# Patient Record
Sex: Female | Born: 1980 | Race: Asian | Hispanic: No | Marital: Married | Smoking: Never smoker
Health system: Southern US, Community
[De-identification: ages and names within clinical notes are randomized; demographics above are authoritative.]

## PROBLEM LIST (undated history)

## (undated) DIAGNOSIS — Z789 Other specified health status: Secondary | ICD-10-CM

---

## 2013-02-19 ENCOUNTER — Other Ambulatory Visit: Payer: Self-pay | Admitting: Infectious Diseases

## 2013-02-19 ENCOUNTER — Ambulatory Visit
Admission: RE | Admit: 2013-02-19 | Discharge: 2013-02-19 | Disposition: A | Discharge status: Home / Self Care | Payer: PRIVATE HEALTH INSURANCE | Source: Ambulatory Visit | Attending: Infectious Diseases | Admitting: Infectious Diseases

## 2013-02-19 DIAGNOSIS — R7611 Nonspecific reaction to tuberculin skin test without active tuberculosis: Secondary | ICD-10-CM

## 2015-01-22 ENCOUNTER — Encounter: Payer: Self-pay | Admitting: Obstetrics and Gynecology

## 2015-04-15 LAB — GLUCOSE, POCT (MANUAL RESULT ENTRY): POC Glucose: 133 mg/dl — AB (ref 70–99)

## 2015-09-23 ENCOUNTER — Encounter: Payer: Self-pay | Admitting: *Deleted

## 2015-09-23 DIAGNOSIS — Z139 Encounter for screening, unspecified: Secondary | ICD-10-CM

## 2015-09-23 NOTE — Congregational Nurse Program (Signed)
Congregational Nurse Program Note  Date of Encounter: 09/23/2015  Past Medical History: No past medical history on file.  Encounter Details:     CNP Questionnaire - 09/23/15 1841    Patient Demographics   Is this a new or existing patient? New   Patient is considered a/an Asylee   Race Asian   Patient Assistance   Location of Patient Assistance [place]   Patient's financial/insurance status Medicaid   Uninsured Patient No   Patient referred to apply for the following financial assistance Not Applicable   Food insecurities addressed Not Applicable   Transportation assistance No   Assistance securing medications No   Educational health offerings Other   Encounter Details   Primary purpose of visit Other   Was an Emergency Department visit averted? Not Applicable   Does patient have a medical provider? Yes   Patient referred to Not Applicable   Was a mental health screening completed? (GAINS tool) No   Does patient have dental issues? No   Does patient have vision issues? No   Since previous encounter, have you referred patient for abnormal blood pressure that resulted in a new diagnosis or medication change? No   Since previous encounter, have you referred patient for abnormal blood glucose that resulted in a new diagnosis or medication change? No       Client came to center to get help to change her Medicaid [facility]  . Changes were made and  client already has appointment with Doctors. She is four months pregnant. [phone]

## 2015-10-16 LAB — OB RESULTS CONSOLE ABO/RH: RH TYPE: POSITIVE

## 2015-10-16 LAB — OB RESULTS CONSOLE GC/CHLAMYDIA
CHLAMYDIA, DNA PROBE: NEGATIVE
Gonorrhea: NEGATIVE

## 2015-10-16 LAB — OB RESULTS CONSOLE RUBELLA ANTIBODY, IGM: Rubella: IMMUNE

## 2015-10-16 LAB — OB RESULTS CONSOLE ANTIBODY SCREEN: ANTIBODY SCREEN: NEGATIVE

## 2015-10-16 LAB — OB RESULTS CONSOLE HIV ANTIBODY (ROUTINE TESTING): HIV: NONREACTIVE

## 2015-10-16 LAB — OB RESULTS CONSOLE RPR: RPR: NONREACTIVE

## 2015-10-16 LAB — OB RESULTS CONSOLE HEPATITIS B SURFACE ANTIGEN: HEP B S AG: NEGATIVE

## 2015-10-17 ENCOUNTER — Other Ambulatory Visit (HOSPITAL_COMMUNITY): Payer: Self-pay | Admitting: Nurse Practitioner

## 2015-10-17 DIAGNOSIS — Z3A21 21 weeks gestation of pregnancy: Secondary | ICD-10-CM

## 2015-10-17 DIAGNOSIS — Z3689 Encounter for other specified antenatal screening: Secondary | ICD-10-CM

## 2015-10-17 DIAGNOSIS — O09522 Supervision of elderly multigravida, second trimester: Secondary | ICD-10-CM

## 2015-10-23 ENCOUNTER — Ambulatory Visit (HOSPITAL_COMMUNITY)
Admission: RE | Admit: 2015-10-23 | Discharge: 2015-10-23 | Disposition: A | Discharge status: Home / Self Care | Payer: Medicaid Other | Source: Ambulatory Visit | Attending: Nurse Practitioner | Admitting: Nurse Practitioner

## 2015-10-23 ENCOUNTER — Other Ambulatory Visit (HOSPITAL_COMMUNITY): Payer: Self-pay | Admitting: Nurse Practitioner

## 2015-10-23 ENCOUNTER — Encounter (HOSPITAL_COMMUNITY): Payer: Self-pay

## 2015-10-23 VITALS — BP 108/67 | HR 87 | Wt 114.4 lb

## 2015-10-23 DIAGNOSIS — Z3A21 21 weeks gestation of pregnancy: Secondary | ICD-10-CM

## 2015-10-23 DIAGNOSIS — O0932 Supervision of pregnancy with insufficient antenatal care, second trimester: Secondary | ICD-10-CM | POA: Insufficient documentation

## 2015-10-23 DIAGNOSIS — O35EXX0 Maternal care for other (suspected) fetal abnormality and damage, fetal genitourinary anomalies, not applicable or unspecified: Secondary | ICD-10-CM

## 2015-10-23 DIAGNOSIS — Z3689 Encounter for other specified antenatal screening: Secondary | ICD-10-CM

## 2015-10-23 DIAGNOSIS — O09522 Supervision of elderly multigravida, second trimester: Secondary | ICD-10-CM | POA: Diagnosis not present

## 2015-10-23 DIAGNOSIS — Z3A22 22 weeks gestation of pregnancy: Secondary | ICD-10-CM | POA: Insufficient documentation

## 2015-10-23 DIAGNOSIS — Z36 Encounter for antenatal screening of mother: Secondary | ICD-10-CM | POA: Diagnosis not present

## 2015-10-23 DIAGNOSIS — O34219 Maternal care for unspecified type scar from previous cesarean delivery: Secondary | ICD-10-CM

## 2015-10-23 DIAGNOSIS — O09529 Supervision of elderly multigravida, unspecified trimester: Secondary | ICD-10-CM

## 2015-10-23 DIAGNOSIS — Z315 Encounter for genetic counseling: Secondary | ICD-10-CM | POA: Insufficient documentation

## 2015-10-23 DIAGNOSIS — O358XX0 Maternal care for other (suspected) fetal abnormality and damage, not applicable or unspecified: Secondary | ICD-10-CM

## 2015-10-23 HISTORY — DX: Other specified health status: Z78.9

## 2015-10-23 NOTE — ED Notes (Signed)
Pt reports low back pain.

## 2015-10-23 NOTE — Progress Notes (Signed)
Genetic Counseling  High-Risk Gestation Note  Appointment Date:  10/23/2015 Referred By: Alberteen Spindle, NP Date of Birth:  1980-09-23   Pregnancy History: G2P1001 Estimated Date of Delivery: 02/24/16 Estimated Gestational Age: [redacted]w[redacted]d Attending: Alpha Gula, MD  [name] was seen for genetic counseling because of a maternal age of 35 y.o..  Burmese/English interpretation provided by Win from [employer].   In Summary:   Reviewed maternal age related risk for fetal aneuploidy  Detailed ultrasound today visualized bilateral mild pyelectasis (urinary tract dilation)  Reviewed as soft marker for Down syndrome, slightly adjusting patient's age related risk  Follow-up ultrasound scheduled for 12/18/15  Patient declined NIPS and amniocentesis  Family history unremarkable   She was counseled regarding maternal age and the association with risk for chromosome conditions due to nondisjunction with aging of the ova.   We reviewed chromosomes, nondisjunction, and the associated 1 in 111 risk for fetal aneuploidy related to a maternal age of 35 y.o. at [redacted]w[redacted]d gestation.  She was counseled that the risk for aneuploidy decreases as gestational age increases, accounting for those pregnancies which spontaneously abort.  We specifically discussed Down syndrome (trisomy 31), trisomies 4 and 24, and sex chromosome aneuploidies (47,XXX and 47,XXY) including the common features and prognoses of each.   Detailed ultrasound was performed today. Mild, bilateral urinary tract dilation visualized (~4 mm). Remaining visualized fetal anatomy appeared normal. Complete ultrasound results reported separately. We reviewed the benefits and limitations of ultrasound as a screening tool for fetal aneuploidy.   We discussed that fetal urinary tract dilation (pyelectasis) is defined as the dilatation of the fetal renal pelvis/pelvises due to excess urine. This finding is estimated to occur in 2-3% of fetuses.   The female to female ratio is 2:1.  Typically, babies with mild pyelectasis are born normal and healthy and we are usually unable to determine why this extra fluid is present.  This urine accumulation may regress, stay the same or continue to accumulate.  The more fluid that accumulates, the more likely this fluid could be the result of a compromise in kidney function, an obstruction, or narrowing of the ureters which transport urine out of the body, thus causing backflow of fluid into the kidneys.  Therefore, it is important to follow pyelectasis to make sure it does not become more concerning.  Also, in some cases postnatal evaluation of baby's kidneys may be warranted.  We discussed that the finding of pyelectasis is associated with an increased risk for fetal aneuploidy.  This risk is highest when other anomalies or fetal differences are visualized. The finding of mild urinary tract dilation would slightly increase the chance for fetal Down syndrome above the patient's age related risk to approximately 1 in 125.   We reviewed the additional available screening option of noninvasive prenatal screening (NIPS)/cell free DNA (cfDNA) testing.  She was counseled that screening tests are used to modify a patient's a priori risk for aneuploidy, typically based on age. This estimate provides a pregnancy specific risk assessment. We reviewed the benefits and limitations of each option. Specifically, we discussed the conditions for which each test screens, the detection rates, and false positive rates of each. She was also counseled regarding diagnostic testing via amniocentesis. We reviewed the approximate 1 in 300-500 risk for complications for amniocentesis, including spontaneous pregnancy loss. We discussed the possible results that the tests might provide including: positive, negative, unanticipated, and no result. Finally, she was counseled regarding the cost of each option  and potential out of pocket expenses.  After consideration of all the options, she declined NIPS and amniocentesis.   Follow-up ultrasound is scheduled for 12/18/15 to reassess fetal kidneys. She understands that screening tests cannot rule out all birth defects or genetic syndromes. The patient was advised of this limitation and states she still does not want additional testing at this time.   Both family histories were reviewed and found to be noncontributory for birth defects, intellectual disability, and known genetic conditions. Without further information regarding the provided family history, an accurate genetic risk cannot be calculated. Further genetic counseling is warranted if more information is obtained.  [name] denied exposure to environmental toxins or chemical agents. She denied the use of alcohol, tobacco or street drugs. She denied significant viral illnesses during the course of her pregnancy. Her medical and surgical histories were noncontributory.   I counseled [name] regarding the above risks and available options.  The approximate face-to-face time with the genetic counselor was 45 minutes.  Quinn PlowmanKaren Daisie Haft, MS,  Certified Genetic Counselor 10/23/2015

## 2015-11-18 ENCOUNTER — Encounter: Payer: Self-pay | Admitting: *Deleted

## 2015-11-18 DIAGNOSIS — Z139 Encounter for screening, unspecified: Secondary | ICD-10-CM

## 2015-11-18 NOTE — Congregational Nurse Program (Signed)
Congregational Nurse Program Note  Date of Encounter: 11/18/2015  Past Medical History: Past Medical History  Diagnosis Date  . Medical history non-contributory     Encounter Details:     CNP Questionnaire - 11/18/15 1549    Patient Demographics   Is this a new or existing patient? New   Patient is considered a/an Asylee   Race Asian   Patient Assistance   Location of Patient Assistance [place]   Patient's financial/insurance status Medicaid   Uninsured Patient No   Patient referred to apply for the following financial assistance Not Applicable   Food insecurities addressed Not Applicable   Transportation assistance No   Assistance securing medications No   Educational health offerings Other   Encounter Details   Primary purpose of visit Other   Was an Emergency Department visit averted? Not Applicable   Does patient have a medical provider? Yes   Patient referred to Not Applicable   Was a mental health screening completed? (GAINS tool) No   Does patient have dental issues? No   Does patient have vision issues? No   Since previous encounter, have you referred patient for abnormal blood pressure that resulted in a new diagnosis or medication change? No   Since previous encounter, have you referred patient for abnormal blood glucose that resulted in a new diagnosis or medication change? No       Client only wanted her BP checked and also more prenatal vitamins. It was explained to her by interpreter Ree that she has to go to her MD / Health Department to get more. She already has appointment but was told to go now to get her medications.Estée LauderHelena Maren Wiesen RN BSN CN [phone]

## 2015-12-18 ENCOUNTER — Encounter (HOSPITAL_COMMUNITY): Payer: Self-pay

## 2015-12-18 ENCOUNTER — Ambulatory Visit (HOSPITAL_COMMUNITY)
Admission: RE | Admit: 2015-12-18 | Discharge: 2015-12-18 | Disposition: A | Discharge status: Home / Self Care | Payer: Medicaid Other | Source: Ambulatory Visit | Attending: Nurse Practitioner | Admitting: Nurse Practitioner

## 2015-12-18 ENCOUNTER — Other Ambulatory Visit (HOSPITAL_COMMUNITY): Payer: Self-pay | Admitting: Maternal and Fetal Medicine

## 2015-12-18 ENCOUNTER — Ambulatory Visit (HOSPITAL_COMMUNITY): Payer: Medicaid Other

## 2015-12-18 VITALS — BP 103/61 | HR 92 | Wt 120.2 lb

## 2015-12-18 DIAGNOSIS — O35EXX0 Maternal care for other (suspected) fetal abnormality and damage, fetal genitourinary anomalies, not applicable or unspecified: Secondary | ICD-10-CM

## 2015-12-18 DIAGNOSIS — Z3A30 30 weeks gestation of pregnancy: Secondary | ICD-10-CM

## 2015-12-18 DIAGNOSIS — O09523 Supervision of elderly multigravida, third trimester: Secondary | ICD-10-CM

## 2015-12-18 DIAGNOSIS — O358XX0 Maternal care for other (suspected) fetal abnormality and damage, not applicable or unspecified: Secondary | ICD-10-CM

## 2015-12-18 DIAGNOSIS — O09529 Supervision of elderly multigravida, unspecified trimester: Secondary | ICD-10-CM

## 2016-01-22 ENCOUNTER — Other Ambulatory Visit: Payer: Self-pay | Admitting: Obstetrics & Gynecology

## 2016-01-29 ENCOUNTER — Encounter (HOSPITAL_COMMUNITY): Payer: Self-pay

## 2016-01-29 ENCOUNTER — Ambulatory Visit (HOSPITAL_COMMUNITY)
Admission: RE | Admit: 2016-01-29 | Discharge: 2016-01-29 | Disposition: A | Discharge status: Home / Self Care | Payer: Medicaid Other | Source: Ambulatory Visit | Attending: Nurse Practitioner | Admitting: Nurse Practitioner

## 2016-01-29 DIAGNOSIS — Z36 Encounter for antenatal screening of mother: Secondary | ICD-10-CM | POA: Diagnosis present

## 2016-01-29 DIAGNOSIS — O0932 Supervision of pregnancy with insufficient antenatal care, second trimester: Secondary | ICD-10-CM | POA: Diagnosis not present

## 2016-01-29 DIAGNOSIS — Q620 Congenital hydronephrosis: Secondary | ICD-10-CM | POA: Diagnosis not present

## 2016-01-29 DIAGNOSIS — O09523 Supervision of elderly multigravida, third trimester: Secondary | ICD-10-CM | POA: Diagnosis present

## 2016-01-29 DIAGNOSIS — O358XX0 Maternal care for other (suspected) fetal abnormality and damage, not applicable or unspecified: Secondary | ICD-10-CM | POA: Diagnosis present

## 2016-01-29 DIAGNOSIS — Z3A36 36 weeks gestation of pregnancy: Secondary | ICD-10-CM | POA: Insufficient documentation

## 2016-01-29 DIAGNOSIS — O35EXX0 Maternal care for other (suspected) fetal abnormality and damage, fetal genitourinary anomalies, not applicable or unspecified: Secondary | ICD-10-CM

## 2016-02-06 ENCOUNTER — Encounter (HOSPITAL_COMMUNITY): Payer: Self-pay

## 2016-02-06 NOTE — Pre-Procedure Instructions (Signed)
[phone] interpreter number

## 2016-02-13 ENCOUNTER — Encounter (HOSPITAL_COMMUNITY): Payer: Self-pay

## 2016-02-13 NOTE — Patient Instructions (Signed)
[address]  02/13/2016   Your procedure is scheduled on:  02/17/2016  Enter through the Main Entrance of [facility] at 0800 AM.  Pick up the phone at the desk and dial [phone].   Call this number if you have problems the morning of surgery: [phone]   Remember:   Do not eat food:After Midnight.  Do not drink clear liquids: After Midnight.  Take these medicines the morning of surgery with A SIP OF WATER: none   Do not wear jewelry, make-up or nail polish.  Do not wear lotions, powders, or perfumes. You may wear deodorant.  Do not shave 48 hours prior to surgery.  Do not bring valuables to the hospital.  [facility] is not   responsible for any belongings or valuables brought to the hospital.  Contacts, dentures or bridgework may not be worn into surgery.  Leave suitcase in the car. After surgery it may be brought to your room.  For patients admitted to the hospital, checkout time is 11:00 AM the day of              discharge.   Patients discharged the day of surgery will not be allowed to drive             home.  Name and phone number of your driver: na  Special Instructions:   N/A   Please read over the following fact sheets that you were given:   Surgical Site Infection Prevention

## 2016-02-16 ENCOUNTER — Encounter (HOSPITAL_COMMUNITY)
Admission: RE | Admit: 2016-02-16 | Discharge: 2016-02-16 | Disposition: A | Discharge status: Home / Self Care | Payer: Medicaid Other | Source: Ambulatory Visit | Attending: Obstetrics & Gynecology | Admitting: Obstetrics & Gynecology

## 2016-02-16 LAB — CBC
HCT: 32.7 % — ABNORMAL LOW (ref 36.0–46.0)
HEMOGLOBIN: 11 g/dL — AB (ref 12.0–15.0)
MCH: 31.3 pg (ref 26.0–34.0)
MCHC: 33.6 g/dL (ref 30.0–36.0)
MCV: 92.9 fL (ref 78.0–100.0)
PLATELETS: 217 10*3/uL (ref 150–400)
RBC: 3.52 MIL/uL — AB (ref 3.87–5.11)
RDW: 12.8 % (ref 11.5–15.5)
WBC: 8.3 10*3/uL (ref 4.0–10.5)

## 2016-02-16 LAB — TYPE AND SCREEN
ABO/RH(D): A POS
ANTIBODY SCREEN: NEGATIVE

## 2016-02-16 LAB — ABO/RH: ABO/RH(D): A POS

## 2016-02-17 ENCOUNTER — Inpatient Hospital Stay (HOSPITAL_COMMUNITY): Payer: Medicaid Other | Admitting: Anesthesiology

## 2016-02-17 ENCOUNTER — Encounter (HOSPITAL_COMMUNITY): Payer: Self-pay | Admitting: Anesthesiology

## 2016-02-17 ENCOUNTER — Encounter (HOSPITAL_COMMUNITY)
Admission: RE | Disposition: A | Discharge status: Home / Self Care | Payer: Self-pay | Source: Ambulatory Visit | Attending: Obstetrics & Gynecology

## 2016-02-17 ENCOUNTER — Inpatient Hospital Stay (HOSPITAL_COMMUNITY)
Admission: RE | Admit: 2016-02-17 | Discharge: 2016-02-19 | DRG: 766 | Disposition: A | Discharge status: Home / Self Care | Payer: Medicaid Other | Source: Ambulatory Visit | Attending: Obstetrics & Gynecology | Admitting: Obstetrics & Gynecology

## 2016-02-17 DIAGNOSIS — Z3A39 39 weeks gestation of pregnancy: Secondary | ICD-10-CM

## 2016-02-17 DIAGNOSIS — Z8249 Family history of ischemic heart disease and other diseases of the circulatory system: Secondary | ICD-10-CM

## 2016-02-17 DIAGNOSIS — O09529 Supervision of elderly multigravida, unspecified trimester: Secondary | ICD-10-CM

## 2016-02-17 DIAGNOSIS — O34219 Maternal care for unspecified type scar from previous cesarean delivery: Secondary | ICD-10-CM | POA: Diagnosis present

## 2016-02-17 DIAGNOSIS — O34211 Maternal care for low transverse scar from previous cesarean delivery: Principal | ICD-10-CM | POA: Diagnosis present

## 2016-02-17 LAB — RPR: RPR: NONREACTIVE

## 2016-02-17 SURGERY — Surgical Case
Anesthesia: Spinal

## 2016-02-17 MED ORDER — BUPIVACAINE IN DEXTROSE 0.75-8.25 % IT SOLN
INTRATHECAL | Status: DC | PRN
  Administered 2016-02-17: 9 mg via INTRATHECAL

## 2016-02-17 MED ORDER — NALBUPHINE HCL 10 MG/ML IJ SOLN: 5.0000 mg | Freq: Once | INTRAMUSCULAR | Status: DC | PRN

## 2016-02-17 MED ORDER — NALOXONE HCL 2 MG/2ML IJ SOSY
1.0000 ug/kg/h | PREFILLED_SYRINGE | INTRAMUSCULAR | Status: DC | PRN
  Filled 2016-02-17: qty 2

## 2016-02-17 MED ORDER — BUPIVACAINE HCL (PF) 0.5 % IJ SOLN
INTRAMUSCULAR | Status: AC
Start: 2016-02-17 — End: 2016-02-17
  Filled 2016-02-17: qty 30

## 2016-02-17 MED ORDER — ACETAMINOPHEN 325 MG PO TABS: 650.0000 mg | ORAL_TABLET | ORAL | Status: DC | PRN

## 2016-02-17 MED ORDER — MEPERIDINE HCL 25 MG/ML IJ SOLN: 6.2500 mg | INTRAMUSCULAR | Status: DC | PRN

## 2016-02-17 MED ORDER — SODIUM CHLORIDE 0.9 % IR SOLN
Status: DC | PRN
  Administered 2016-02-17: 1

## 2016-02-17 MED ORDER — SODIUM CHLORIDE 0.9% FLUSH: 3.0000 mL | INTRAVENOUS | Status: DC | PRN

## 2016-02-17 MED ORDER — PHENYLEPHRINE 8 MG IN D5W 100 ML (0.08MG/ML) PREMIX OPTIME
INJECTION | INTRAVENOUS | Status: DC | PRN
  Administered 2016-02-17: 60 ug/min via INTRAVENOUS

## 2016-02-17 MED ORDER — ZOLPIDEM TARTRATE 5 MG PO TABS: 5.0000 mg | ORAL_TABLET | Freq: Every evening | ORAL | Status: DC | PRN

## 2016-02-17 MED ORDER — KETOROLAC TROMETHAMINE 30 MG/ML IJ SOLN
30.0000 mg | Freq: Four times a day (QID) | INTRAMUSCULAR | Status: AC | PRN
  Administered 2016-02-17: 30 mg via INTRAMUSCULAR

## 2016-02-17 MED ORDER — ONDANSETRON HCL 4 MG/2ML IJ SOLN
INTRAMUSCULAR | Status: DC | PRN
  Administered 2016-02-17: 4 mg via INTRAVENOUS

## 2016-02-17 MED ORDER — PROMETHAZINE HCL 25 MG/ML IJ SOLN: 6.2500 mg | INTRAMUSCULAR | Status: DC | PRN

## 2016-02-17 MED ORDER — SIMETHICONE 80 MG PO CHEW
80.0000 mg | CHEWABLE_TABLET | Freq: Three times a day (TID) | ORAL | Status: DC
  Administered 2016-02-17 – 2016-02-19 (×5): 80 mg via ORAL
  Filled 2016-02-17 (×5): qty 1

## 2016-02-17 MED ORDER — LACTATED RINGERS IV SOLN
INTRAVENOUS | Status: DC
  Administered 2016-02-17 (×3): via INTRAVENOUS

## 2016-02-17 MED ORDER — DEXAMETHASONE SODIUM PHOSPHATE 4 MG/ML IJ SOLN
INTRAMUSCULAR | Status: DC | PRN
  Administered 2016-02-17: 4 mg via INTRAVENOUS

## 2016-02-17 MED ORDER — LACTATED RINGERS IV SOLN
Freq: Once | INTRAVENOUS | Status: AC
  Administered 2016-02-17: 09:00:00 via INTRAVENOUS

## 2016-02-17 MED ORDER — SIMETHICONE 80 MG PO CHEW: 80.0000 mg | CHEWABLE_TABLET | ORAL | Status: DC | PRN

## 2016-02-17 MED ORDER — IBUPROFEN 600 MG PO TABS: 600.0000 mg | ORAL_TABLET | Freq: Four times a day (QID) | ORAL | Status: DC

## 2016-02-17 MED ORDER — NALBUPHINE HCL 10 MG/ML IJ SOLN: 5.0000 mg | INTRAMUSCULAR | Status: DC | PRN

## 2016-02-17 MED ORDER — SCOPOLAMINE 1 MG/3DAYS TD PT72
MEDICATED_PATCH | TRANSDERMAL | Status: AC
  Administered 2016-02-17: 1.5 mg via TRANSDERMAL
  Filled 2016-02-17: qty 1

## 2016-02-17 MED ORDER — COCONUT OIL OIL: 1.0000 "application " | TOPICAL_OIL | Status: DC | PRN

## 2016-02-17 MED ORDER — MORPHINE SULFATE (PF) 0.5 MG/ML IJ SOLN
INTRAMUSCULAR | Status: DC | PRN
  Administered 2016-02-17: .2 mg via INTRATHECAL

## 2016-02-17 MED ORDER — BUPIVACAINE HCL (PF) 0.5 % IJ SOLN
INTRAMUSCULAR | Status: DC | PRN
  Administered 2016-02-17: 30 mL

## 2016-02-17 MED ORDER — TETANUS-DIPHTH-ACELL PERTUSSIS 5-2.5-18.5 LF-MCG/0.5 IM SUSP: 0.5000 mL | Freq: Once | INTRAMUSCULAR | Status: DC

## 2016-02-17 MED ORDER — OXYCODONE-ACETAMINOPHEN 5-325 MG PO TABS: 2.0000 | ORAL_TABLET | ORAL | Status: DC | PRN

## 2016-02-17 MED ORDER — KETOROLAC TROMETHAMINE 30 MG/ML IJ SOLN
INTRAMUSCULAR | Status: AC
  Administered 2016-02-17: 30 mg via INTRAMUSCULAR
  Filled 2016-02-17: qty 1

## 2016-02-17 MED ORDER — OXYCODONE-ACETAMINOPHEN 5-325 MG PO TABS
1.0000 | ORAL_TABLET | ORAL | Status: DC | PRN
  Administered 2016-02-18 – 2016-02-19 (×6): 1 via ORAL
  Filled 2016-02-17 (×6): qty 1

## 2016-02-17 MED ORDER — DIPHENHYDRAMINE HCL 25 MG PO CAPS
25.0000 mg | ORAL_CAPSULE | ORAL | Status: DC | PRN
  Filled 2016-02-17: qty 1

## 2016-02-17 MED ORDER — DIPHENHYDRAMINE HCL 25 MG PO CAPS: 25.0000 mg | ORAL_CAPSULE | Freq: Four times a day (QID) | ORAL | Status: DC | PRN

## 2016-02-17 MED ORDER — CEFAZOLIN SODIUM-DEXTROSE 2-4 GM/100ML-% IV SOLN
2.0000 g | INTRAVENOUS | Status: AC
  Administered 2016-02-17: 2 g via INTRAVENOUS

## 2016-02-17 MED ORDER — SIMETHICONE 80 MG PO CHEW
80.0000 mg | CHEWABLE_TABLET | ORAL | Status: DC
  Administered 2016-02-18 (×2): 80 mg via ORAL
  Filled 2016-02-17 (×2): qty 1

## 2016-02-17 MED ORDER — OXYTOCIN 10 UNIT/ML IJ SOLN
INTRAVENOUS | Status: DC | PRN
  Administered 2016-02-17: 40 [IU] via INTRAVENOUS

## 2016-02-17 MED ORDER — MIDAZOLAM HCL 2 MG/2ML IJ SOLN: 0.5000 mg | Freq: Once | INTRAMUSCULAR | Status: DC | PRN

## 2016-02-17 MED ORDER — MORPHINE SULFATE (PF) 4 MG/ML IV SOLN: 1.0000 mg | INTRAVENOUS | Status: DC | PRN

## 2016-02-17 MED ORDER — NALOXONE HCL 0.4 MG/ML IJ SOLN: 0.4000 mg | INTRAMUSCULAR | Status: DC | PRN

## 2016-02-17 MED ORDER — SENNOSIDES-DOCUSATE SODIUM 8.6-50 MG PO TABS
2.0000 | ORAL_TABLET | ORAL | Status: DC
  Administered 2016-02-18 (×2): 2 via ORAL
  Filled 2016-02-17 (×2): qty 2

## 2016-02-17 MED ORDER — LACTATED RINGERS IV SOLN
INTRAVENOUS | Status: DC
  Administered 2016-02-17 – 2016-02-18 (×2): 999 mL via INTRAVENOUS

## 2016-02-17 MED ORDER — DEXAMETHASONE SODIUM PHOSPHATE 4 MG/ML IJ SOLN
INTRAMUSCULAR | Status: AC
  Filled 2016-02-17: qty 1

## 2016-02-17 MED ORDER — OXYTOCIN 10 UNIT/ML IJ SOLN
INTRAMUSCULAR | Status: AC
  Filled 2016-02-17: qty 4

## 2016-02-17 MED ORDER — PRENATAL MULTIVITAMIN CH
1.0000 | ORAL_TABLET | Freq: Every day | ORAL | Status: DC
  Administered 2016-02-18 – 2016-02-19 (×2): 1 via ORAL
  Filled 2016-02-17 (×2): qty 1

## 2016-02-17 MED ORDER — DIBUCAINE 1 % RE OINT: 1.0000 "application " | TOPICAL_OINTMENT | RECTAL | Status: DC | PRN

## 2016-02-17 MED ORDER — ONDANSETRON HCL 4 MG/2ML IJ SOLN
INTRAMUSCULAR | Status: AC
  Filled 2016-02-17: qty 2

## 2016-02-17 MED ORDER — OXYTOCIN 40 UNITS IN LACTATED RINGERS INFUSION - SIMPLE MED: 2.5000 [IU]/h | INTRAVENOUS | Status: AC

## 2016-02-17 MED ORDER — MORPHINE SULFATE-NACL 0.5-0.9 MG/ML-% IV SOSY
PREFILLED_SYRINGE | INTRAVENOUS | Status: AC
Start: 2016-02-17 — End: 2016-02-17
  Filled 2016-02-17: qty 1

## 2016-02-17 MED ORDER — WITCH HAZEL-GLYCERIN EX PADS: 1.0000 "application " | MEDICATED_PAD | CUTANEOUS | Status: DC | PRN

## 2016-02-17 MED ORDER — DIPHENHYDRAMINE HCL 50 MG/ML IJ SOLN: 12.5000 mg | INTRAMUSCULAR | Status: DC | PRN

## 2016-02-17 MED ORDER — ONDANSETRON HCL 4 MG/2ML IJ SOLN: 4.0000 mg | Freq: Three times a day (TID) | INTRAMUSCULAR | Status: DC | PRN

## 2016-02-17 MED ORDER — LACTATED RINGERS IV SOLN: INTRAVENOUS | Status: DC

## 2016-02-17 MED ORDER — FENTANYL CITRATE (PF) 100 MCG/2ML IJ SOLN
INTRAMUSCULAR | Status: DC | PRN
  Administered 2016-02-17: 10 ug via INTRATHECAL

## 2016-02-17 MED ORDER — PHENYLEPHRINE 8 MG IN D5W 100 ML (0.08MG/ML) PREMIX OPTIME
INJECTION | INTRAVENOUS | Status: AC
  Filled 2016-02-17: qty 100

## 2016-02-17 MED ORDER — IBUPROFEN 600 MG PO TABS
600.0000 mg | ORAL_TABLET | Freq: Four times a day (QID) | ORAL | Status: DC
  Administered 2016-02-17 – 2016-02-19 (×8): 600 mg via ORAL
  Filled 2016-02-17 (×8): qty 1

## 2016-02-17 MED ORDER — KETOROLAC TROMETHAMINE 30 MG/ML IJ SOLN: 30.0000 mg | Freq: Four times a day (QID) | INTRAMUSCULAR | Status: AC | PRN

## 2016-02-17 MED ORDER — FENTANYL CITRATE (PF) 100 MCG/2ML IJ SOLN
INTRAMUSCULAR | Status: AC
  Filled 2016-02-17: qty 2

## 2016-02-17 MED ORDER — SCOPOLAMINE 1 MG/3DAYS TD PT72: 1.0000 | MEDICATED_PATCH | Freq: Once | TRANSDERMAL | Status: DC

## 2016-02-17 MED ORDER — MENTHOL 3 MG MT LOZG: 1.0000 | LOZENGE | OROMUCOSAL | Status: DC | PRN

## 2016-02-17 MED ORDER — SCOPOLAMINE 1 MG/3DAYS TD PT72
1.0000 | MEDICATED_PATCH | Freq: Once | TRANSDERMAL | Status: DC
  Administered 2016-02-17: 1.5 mg via TRANSDERMAL

## 2016-02-17 SURGICAL SUPPLY — 33 items
BARRIER ADHS 3X4 INTERCEED (GAUZE/BANDAGES/DRESSINGS) IMPLANT
BENZOIN TINCTURE PRP APPL 2/3 (GAUZE/BANDAGES/DRESSINGS) ×3 IMPLANT
CHLORAPREP W/TINT 26ML (MISCELLANEOUS) ×3 IMPLANT
CLAMP CORD UMBIL (MISCELLANEOUS) IMPLANT
CLOSURE WOUND 1/2 X4 (GAUZE/BANDAGES/DRESSINGS) ×1
CLOTH BEACON ORANGE TIMEOUT ST (SAFETY) ×3 IMPLANT
DECANTER SPIKE VIAL GLASS SM (MISCELLANEOUS) ×3 IMPLANT
DRSG OPSITE POSTOP 4X10 (GAUZE/BANDAGES/DRESSINGS) ×3 IMPLANT
ELECT REM PT RETURN 9FT ADLT (ELECTROSURGICAL) ×3
ELECTRODE REM PT RTRN 9FT ADLT (ELECTROSURGICAL) ×1 IMPLANT
EXTRACTOR VACUUM KIWI (MISCELLANEOUS) IMPLANT
GLOVE BIO SURGEON STRL SZ 6.5 (GLOVE) ×2 IMPLANT
GLOVE BIO SURGEONS STRL SZ 6.5 (GLOVE) ×1
GLOVE BIOGEL PI IND STRL 7.0 (GLOVE) ×2 IMPLANT
GLOVE BIOGEL PI INDICATOR 7.0 (GLOVE) ×4
GOWN STRL REUS W/TWL LRG LVL3 (GOWN DISPOSABLE) ×6 IMPLANT
KIT ABG SYR 3ML LUER SLIP (SYRINGE) IMPLANT
NEEDLE HYPO 22GX1.5 SAFETY (NEEDLE) IMPLANT
NEEDLE HYPO 25X5/8 SAFETYGLIDE (NEEDLE) IMPLANT
NS IRRIG 1000ML POUR BTL (IV SOLUTION) ×3 IMPLANT
PACK C SECTION WH (CUSTOM PROCEDURE TRAY) ×3 IMPLANT
PAD OB MATERNITY 4.3X12.25 (PERSONAL CARE ITEMS) ×3 IMPLANT
PENCIL SMOKE EVAC W/HOLSTER (ELECTROSURGICAL) ×3 IMPLANT
RETRACTOR WND ALEXIS 25 LRG (MISCELLANEOUS) IMPLANT
RTRCTR WOUND ALEXIS 25CM LRG (MISCELLANEOUS)
STRIP CLOSURE SKIN 1/2X4 (GAUZE/BANDAGES/DRESSINGS) ×2 IMPLANT
SUT VIC AB 0 CT1 36 (SUTURE) ×18 IMPLANT
SUT VIC AB 2-0 CT1 27 (SUTURE) ×2
SUT VIC AB 2-0 CT1 TAPERPNT 27 (SUTURE) ×1 IMPLANT
SUT VIC AB 4-0 PS2 27 (SUTURE) ×3 IMPLANT
SYR CONTROL 10ML LL (SYRINGE) IMPLANT
TOWEL OR 17X24 6PK STRL BLUE (TOWEL DISPOSABLE) ×3 IMPLANT
TRAY FOLEY CATH SILVER 14FR (SET/KITS/TRAYS/PACK) IMPLANT

## 2016-02-17 NOTE — Lactation Note (Signed)
This note was copied from a baby's chart. Lactation Consultation Note Initial visit at 9 hours of age, with Burmese interpreter via [employer] line ID # [phone].  Mom has hand pump and shells given by Rn.  Mom has large irregular shaped nipples with nipple on left breast inverted/tucked in on lower half and does not evert completely when hand pumped.  LC gave mom #[id number].  Right nipple is also irregular and somewhat inverted.  Mom has copious amounts of colostrum easily expressed. Mom does have experience with older child breast feeding for 18 months after about 2 months of just pumping due to latch difficulty.  MOm denies pain with latch or other concerns at this time. LC discussed basics and answered questions.  Baby asleep in crib. [place] LC resources given and discussed.  Encouraged to feed with early cues on demand.  Early newborn behavior discussed.  Mom to call for assist as needed.      Patient Name: Darlene Mcdonald Today's Date: 02/17/2016 Reason for consult: Initial assessment   Maternal Data Has patient been taught Hand Expression?: Yes Does the patient have breastfeeding experience prior to this delivery?: Yes  Feeding    LATCH Score/Interventions                      Lactation Tools Discussed/Used WIC Program: Yes Pump Review: Setup, frequency, and cleaning;Milk Storage Initiated by:: JS   Consult Status Consult Status: Follow-up Date: 02/18/16 Follow-up type: In-patient    Corinthian Kemler, Arvella Merles 02/17/2016, 8:16 PM

## 2016-02-17 NOTE — Transfer of Care (Signed)
Immediate Anesthesia Transfer of Care Note  Patient: Darlene Mcdonald  Procedure(s) Performed: Procedure(s): CESAREAN SECTION (N/A)  Patient Location: PACU  Anesthesia Type:Spinal  Level of Consciousness: awake, alert , oriented and patient cooperative  Airway & Oxygen Therapy: Patient Spontanous Breathing  Post-op Assessment: Report given to RN and Post -op Vital signs reviewed and stable  Post vital signs: Reviewed and stable  Last Vitals:  Vitals:   02/17/16 0814  BP: 104/64  Pulse: 84  Resp: 18  Temp: 36.7 C    Last Pain:  Vitals:   02/17/16 0814  TempSrc: Oral      Patients Stated Pain Goal: 4 (02/17/16 0814)  Complications: No apparent anesthesia complications

## 2016-02-17 NOTE — Anesthesia Postprocedure Evaluation (Signed)
Anesthesia Post Note  Patient: Darlene Mcdonald  Procedure(s) Performed: Procedure(s) (LRB): CESAREAN SECTION (N/A)  Patient location during evaluation: PACU Anesthesia Type: Spinal and MAC Level of consciousness: awake and alert, oriented and patient cooperative Pain management: pain level controlled Vital Signs Assessment: post-procedure vital signs reviewed and stable Respiratory status: spontaneous breathing, respiratory function stable and nonlabored ventilation Cardiovascular status: blood pressure returned to baseline and stable Postop Assessment: spinal receding and patient able to bend at knees Anesthetic complications: no     Last Vitals:  Vitals:   02/17/16 1245 02/17/16 1300  BP:  102/63  Pulse:  66  Resp:  16  Temp: 36.7 C 36.8 C    Last Pain:  Vitals:   02/17/16 1300  TempSrc: Oral   Pain Goal: Patients Stated Pain Goal: 4 (02/17/16 0814)               Erling Cruz. Reynol Arnone

## 2016-02-17 NOTE — Anesthesia Procedure Notes (Signed)
Spinal  Patient location during procedure: OR End time: 02/17/2016 10:17 AM Staffing Anesthesiologist: Jairo Ben Performed: anesthesiologist  Preanesthetic Checklist Completed: patient identified, surgical consent, pre-op evaluation, timeout performed, IV checked, risks and benefits discussed and monitors and equipment checked Spinal Block Patient position: sitting Prep: Betadine, site prepped and draped and DuraPrep Patient monitoring: heart rate, continuous pulse ox, blood pressure and cardiac monitor Approach: midline Location: L3-4 Injection technique: single-shot Needle Needle type: Quincke  Needle gauge: 25 G Needle length: 9 cm Assessment Sensory level: T4 Additional Notes Pt identified in Operating room.  Monitors applied. Working IV access confirmed. Sterile prep, drape lumbar spine.  1% lido local L 3,4.  #25ga Quincke into clear CSF L 3,4.  9 mg 0.75% Bupivacaine with dextrose, fentanyl, and Duramorph injected with asp CSF beginning and end of injection.  Patient asymptomatic, VSS, no heme aspirated, tolerated well.  Sandford Craze, MD

## 2016-02-17 NOTE — Op Note (Signed)
Cesarean Section Operative Report  Darlene Mcdonald [facility]  02/17/2016  Indications: Scheduled Proceedure/Maternal Request , repeat cesarean section  Pre-operative Diagnosis: PREVIOUS CESAREAN SECTION.   Post-operative Diagnosis: Same   Surgeon: Surgeon(s) and Role:    * Darlene Phenix, MD - Primary   Attending Attestation: I was present and scrubbed for the entire procedure.   Assistants: Darlene Mow, DO  Anesthesia: spinal    Estimated Blood Loss: 850 ml  Total IV Fluids: 2400 ml LR  Urine Output:: 500 ml clear urine  Specimens: None  Findings: Viable female infant in LOA, cephalic presentation; Apgars 9/9; weight pending; clear amniotic fluid; intact placenta with three vessel cord; normal uterus, fallopian tubes and ovaries bilaterally.  Baby condition / location:  Couplet care / Skin to Skin   Complications: no complications  Indications: Darlene Mcdonald is a 35 y.o. [phone] with an IUP [redacted]w[redacted]d presenting for repeat LTCS.  The risks, benefits, complications, treatment options, and exected outcomes were discussed with the patient . The patient dwith the proposed plan, giving informed consent. identified as Darlene Mcdonald and the procedure verified as C-Section Delivery.  Procedure Details:  The patient was taken back to the operative suite where spinal anesthesia was placed.  A time out was held and the above information confirmed.   After induction of anesthesia, the patient was draped and prepped in the usual sterile manner and placed in a dorsal supine position with a leftward tilt. A Pfannenstiel incision was made and carried down through the subcutaneous tissue to the fascia. Fascial incision was made and extended transversely. The fascia was separated from the underlying rectus tissue superiorly and inferiorly. The peritoneum was identified and entered and extended longitudinally. Alexis retractor was placed. A low transverse uterine incision was made and extended bluntly. Delivered  from cephalic presentation was a viable infant with Apgars and weight as above.  After waiting 60 seconds for delayed cord cutting, the umbilical cord was clamped and cut cord blood was obtained for evaluation. Cord ph was not sent. The placenta was removed Intact and appeared normal. The uterine outline, tubes and ovaries appeared normal. The uterine incision was closed with running locked sutures of 0Vicryl with an imbricating layer of the same.   Hemostasis was observed. The peritoneum was closed with 2-0 vicryl. The rectus muscles were examined and hemostasis observed. The fascia was then reapproximated with running sutures of 0Vicryl. A total of 30 ml 50% Marcaine was injected subcutaneously at the margins of the incision. The subcuticular closure was not needed; suture material:11071}. The skin was closed with 4-0Vicryl.   Instrument, sponge, and needle counts were correct prior the abdominal closure and were correct at the conclusion of the case.     Disposition: PACU - hemodynamically stable.   Maternal Condition: stable       SignedJen Mow, DO 02/17/2016 11:42 AM

## 2016-02-17 NOTE — H&P (Signed)
LABOR AND DELIVERY ADMISSION HISTORY AND PHYSICAL NOTE  Darlene Mcdonald is a 35 y.o. female G2P1001 with IUP at [redacted]w[redacted]d presenting for repeat LTCS.   She reports positive fetal movement. She denies leakage of fluid or vaginal bleeding.  Prenatal History/Complications:  Past Medical History: Past Medical History:  Diagnosis Date  . Medical history non-contributory     Past Surgical History: Past Surgical History:  Procedure Laterality Date  . CESAREAN SECTION      Obstetrical History: OB History    Gravida Para Term Preterm AB Living   SAB TAB Ectopic Multiple Live Births                  Social History: Social History   Social History  . Marital status: Married    Spouse name: N/A  . Number of children: N/A  . Years of education: N/A   Social History Main Topics  . Smoking status: Never Smoker  . Smokeless tobacco: Never Used  . Alcohol use No  . Drug use: No  . Sexual activity: Not Asked   Other Topics Concern  . None   Social History Narrative  . None    Family History: Family History  Problem Relation Age of Onset  . Hypertension Mother     Allergies: No Known Allergies  Prescriptions Prior to Admission  Medication Sig Dispense Refill Last Dose  . Prenatal Vit-Fe Fumarate-FA (PRENATAL MULTIVITAMIN) TABS tablet Take 1 tablet by mouth at bedtime.   02/16/2016 at Unknown time     Review of Systems   All systems reviewed and negative except as stated in HPI  Blood pressure 104/64, pulse 84, temperature 98 F (36.7 C), temperature source Oral, resp. rate 18, last menstrual period 05/20/2015, SpO2 99 %. General appearance: alert, cooperative, appears stated age and no distress Lungs: clear to auscultation bilaterally Heart: regular rate and rhythm Abdomen: soft, non-tender; bowel sounds normal Extremities: No calf swelling or tenderness Presentation: cephalic     Prenatal labs: ABO, Rh: --/--/A POS, A POS (07/31 1140) Antibody:  NEG (07/31 1140) Rubella: !Error! RPR: Non Reactive (07/31 1140)  HBsAg: Negative (03/30 0000)  HIV: Non-reactive (03/30 0000)  GBS:   NEG 1 hr Glucola: 170 3 hr GTT- neg Genetic screening:  Declined Anatomy US: Normal  Prenatal Transfer Tool  Maternal Diabetes: No Genetic Screening: Declined Maternal Ultrasounds/Referrals: Normal Fetal Ultrasounds or other Referrals:  None Maternal Substance Abuse:  No Significant Maternal Medications:  None Significant Maternal Lab Results: None  Results for orders placed or performed during the hospital encounter of 02/16/16 (from the past 24 hour(s))  CBC   Collection Time: 02/16/16 11:40 AM  Result Value Ref Range   WBC 8.3 4.0 - 10.5 K/uL   RBC 3.52 (L) 3.87 - 5.11 MIL/uL   Hemoglobin 11.0 (L) 12.0 - 15.0 g/dL   HCT 40.9 (L) 81.1 - 91.4 %   MCV 92.9 78.0 - 100.0 fL   MCH 31.3 26.0 - 34.0 pg   MCHC 33.6 30.0 - 36.0 g/dL   RDW 78.2 95.6 - 21.3 %   Platelets 217 150 - 400 K/uL  RPR   Collection Time: 02/16/16 11:40 AM  Result Value Ref Range   RPR Ser Ql Non Reactive Non Reactive  Type and screen   Collection Time: 02/16/16 11:40 AM  Result Value Ref Range   ABO/RH(D) A POS    Antibody Screen NEG    Sample  Expiration 02/19/2016   ABO/Rh   Collection Time: 02/16/16 11:40 AM  Result Value Ref Range   ABO/RH(D) A POS     Patient Active Problem List   Diagnosis Date Noted  . Advanced maternal age in multigravida 10/23/2015    Assessment: Darlene Mcdonald is a 35 y.o. G2P1001 at [redacted]w[redacted]d here for RLTCS  #Labor: repeat cesarean #Pain: Spinal #MOF: breast #MOC:Depo #Circ:  No  Jen Mow, DO 02/17/2016, 9:48 AM

## 2016-02-17 NOTE — Progress Notes (Signed)
S/w Adria at [employer] [phone]) and arranged a Burmese interpreter to come on 8/2 and 8/3 at 11:00am.   (If needed for 8/3, please call her again and arrange)

## 2016-02-17 NOTE — Progress Notes (Addendum)
Used language line to explain plan of care for the night and to get order for breakfast in a.m.  Breakfast ordered. #[id number]

## 2016-02-17 NOTE — Anesthesia Preprocedure Evaluation (Signed)
Anesthesia Evaluation  Patient identified by MRN, date of birth, ID band Patient awake    Reviewed: Allergy & Precautions, NPO status , Patient's Chart, lab work & pertinent test results  History of Anesthesia Complications Negative for: history of anesthetic complications  Airway Mallampati: III  TM Distance: >3 FB Neck ROM: Full    Dental  (+) Dental Advisory Given   Pulmonary neg pulmonary ROS,    breath sounds clear to auscultation       Cardiovascular (-) hypertensionnegative cardio ROS   Rhythm:Regular Rate:Normal     Neuro/Psych negative neurological ROS     GI/Hepatic negative GI ROS, Neg liver ROS,   Endo/Other  negative endocrine ROS  Renal/GU negative Renal ROS     Musculoskeletal   Abdominal   Peds  Hematology negative hematology ROS (+) Hb 11.0, plt 217K   Anesthesia Other Findings   Reproductive/Obstetrics (+) Pregnancy                            Anesthesia Physical Anesthesia Plan  ASA: II  Anesthesia Plan: Spinal   Post-op Pain Management:    Induction:   Airway Management Planned: Natural Airway and Nasal Cannula  Additional Equipment:   Intra-op Plan:   Post-operative Plan:   Informed Consent: I have reviewed the patients History and Physical, chart, labs and discussed the procedure including the risks, benefits and alternatives for the proposed anesthesia with the patient or authorized representative who has indicated his/her understanding and acceptance.   Dental advisory given  Plan Discussed with: CRNA and Surgeon  Anesthesia Plan Comments: (Plan routine monitors, SAB)        Anesthesia Quick Evaluation

## 2016-02-18 LAB — CBC
HCT: 27 % — ABNORMAL LOW (ref 36.0–46.0)
Hemoglobin: 9.1 g/dL — ABNORMAL LOW (ref 12.0–15.0)
MCH: 30.8 pg (ref 26.0–34.0)
MCHC: 33.7 g/dL (ref 30.0–36.0)
MCV: 91.5 fL (ref 78.0–100.0)
PLATELETS: 172 10*3/uL (ref 150–400)
RBC: 2.95 MIL/uL — AB (ref 3.87–5.11)
RDW: 12.6 % (ref 11.5–15.5)
WBC: 13.5 10*3/uL — ABNORMAL HIGH (ref 4.0–10.5)

## 2016-02-18 NOTE — Progress Notes (Signed)
UR chart review completed.  

## 2016-02-18 NOTE — Lactation Note (Signed)
This note was copied from a baby's chart. Lactation Consultation Note  Spoke w/ Devon RN and Burmese interpreter unavailable at this time. Devon RN states mother is an experienced w/ breastfeeding and is doing well. Lactation will check on mother in am.   Patient Name: Darlene Mcdonald SUORV'I Date: 02/18/2016     Maternal Data    Feeding Feeding Type: Breast Fed Length of feed: 5 min  LATCH Score/Interventions                      Lactation Tools Discussed/Used     Consult Status      Hardie Pulley 02/18/2016, 3:32 PM

## 2016-02-18 NOTE — Progress Notes (Signed)
Subjective: Postpartum Day #1: Cesarean Delivery Visit conducted w/ Burmese interpreter  Patient reports incisional pain, tolerating PO and no problems voiding; breastfeeding going well; Depo for contraception  Objective: Vital signs in last 24 hours: Temp:  [98 F (36.7 C)-99.2 F (37.3 C)] 98 F (36.7 C) (08/02 1610) Pulse Rate:  [60-69] 60 (08/02 0642) Resp:  [16-18] 18 (08/02 0642) BP: (93-108)/(52-67) 100/62 (08/02 0642) SpO2:  [94 %-98 %] 98 % (08/02 9604)  Physical Exam:  General: alert, cooperative and no distress Lochia: appropriate Uterine Fundus: firm Incision: honeycomb stained and marked DVT Evaluation: No evidence of DVT seen on physical exam.   Recent Labs  02/16/16 1140 02/18/16 0515  HGB 11.0* 9.1*  HCT 32.7* 27.0*    Assessment/Plan: Status post Cesarean section. Doing well postoperatively.  Continue current care; anticipate d/c 02/19/16. Rev'd w/ RN & pt plan for shower and dsg change today.  Cam Hai CNM 02/18/2016, 12:54 PM

## 2016-02-18 NOTE — Progress Notes (Signed)
MOB was referred for history of depression/anxiety.  Referral is screened out by Clinical Social Worker because none of the following criteria appear to apply: -History of anxiety/depression during this pregnancy, or of post-partum depression. - Diagnosis of anxiety and/or depression within last 3 years - History of depression due to pregnancy loss/loss of child or -MOB's symptoms are currently being treated with medication and/or therapy.  Please contact the Clinical Social Worker if needs arise or upon MOB request.     There is no formal dx of PPD or Depression listed in patient's chart. Patient is active with congregational nurses and reported in assessment she was tired as she carried baby on her back most of the time causing back pain in 2016.  MOB was educated by nursing staff about parenting and assistance with not always holding the child.  MOB was active with [facility] and no documentation supporting depression/symotoms at this time.  Deretha Emory, MSW Clinical Social Work: System Insurance underwriter for [employer] social worker [phone]

## 2016-02-18 NOTE — Progress Notes (Signed)
Changed dressing per order used sterile technique Pt tolerated well.

## 2016-02-18 NOTE — Progress Notes (Signed)
POD#1, S/p RLTCS   Attempted to communicate with the patient via the language line this morning x 2 but had connection difficulties so appropriate visit could not be completed. In person Burmese interpreter is set up for 11 am this morning. According to nurses the patient seems to be doing well and had no acute events overnight.  Results for orders placed or performed during the hospital encounter of 02/17/16 (from the past 24 hour(s))  CBC     Status: Abnormal   Collection Time: 02/18/16  5:15 AM  Result Value Ref Range   WBC 13.5 (H) 4.0 - 10.5 K/uL   RBC 2.95 (L) 3.87 - 5.11 MIL/uL   Hemoglobin 9.1 (L) from 11.0 on 7/31 12.0 - 15.0 g/dL   HCT 25.8 (L) 52.7 - 78.2 %   MCV 91.5 78.0 - 100.0 fL   MCH 30.8 26.0 - 34.0 pg   MCHC 33.7 30.0 - 36.0 g/dL   RDW 42.3 53.6 - 14.4 %   Platelets 172 150 - 400 K/uL    Plan for a visit with the interpreter at 11. Possible discharge tomorrow  Andres Ege, MD, PGY-1, MPH  I have seen and examined this patient and I agree with the above. See my Progress Note today. Cam Hai 12:57 PM 02/18/2016

## 2016-02-19 MED ORDER — IBUPROFEN 600 MG PO TABS: 600.0000 mg | ORAL_TABLET | Freq: Four times a day (QID) | ORAL | 0 refills | Status: AC | PRN

## 2016-02-19 MED ORDER — OXYCODONE-ACETAMINOPHEN 5-325 MG PO TABS: 1.0000 | ORAL_TABLET | ORAL | 0 refills | Status: AC | PRN

## 2016-02-19 MED ORDER — POLYETHYLENE GLYCOL 3350 17 G PO PACK
17.0000 g | PACK | Freq: Every day | ORAL | Status: DC
  Filled 2016-02-19 (×2): qty 1

## 2016-02-19 MED ORDER — DOCUSATE SODIUM 100 MG PO CAPS: 100.0000 mg | ORAL_CAPSULE | Freq: Two times a day (BID) | ORAL | 0 refills | Status: AC

## 2016-02-19 NOTE — Lactation Note (Signed)
This note was copied from a baby's chart. Lactation Consultation Note  Video interpretation for Burmese used. Answered question regarding helping nipples evert. Mom encouraged to feed baby 8-12 times/24 hours and with feeding cues.  Reviewed engorgement care and monitoring voids/stools.   Patient Name: Darlene Mcdonald Date: 02/19/2016 Reason for consult: Follow-up assessment   Maternal Data    Feeding Feeding Type: Breast Fed Length of feed: 35 min  LATCH Score/Interventions Latch: Repeated attempts needed to sustain latch, nipple held in mouth throughout feeding, stimulation needed to elicit sucking reflex. Intervention(s): Adjust position;Assist with latch;Breast massage;Breast compression  Audible Swallowing: Spontaneous and intermittent Intervention(s): Skin to skin;Hand expression  Type of Nipple: Flat Intervention(s): Hand pump  Comfort (Breast/Nipple): Soft / non-tender     Hold (Positioning): No assistance needed to correctly position infant at breast.  LATCH Score: 8  Lactation Tools Discussed/Used     Consult Status Consult Status: Complete    Hardie Pulley 02/19/2016, 1:16 PM

## 2016-02-19 NOTE — Discharge Instructions (Signed)
Incision Care  An incision (cut) is when a surgeon cuts into your body. After surgery, the cut needs to be well cared for to keep it from getting infected.  HOW TO CARE FOR YOUR CUT  Take medicines only as told by your doctor.  There are many different ways to close and cover a cut, including stitches, skin glue, and adhesive strips. Follow your doctor's instructions on:  Care of the cut.  Bandage (dressing) changes and removal.  Cut closure removal.  Do not take baths, swim, or use a hot tub until your doctor says it is okay. You may shower as told by your doctor.  Return to your normal diet and activities as allowed by your doctor.  Use medicine that helps lessen itching on your cut as told by your doctor. Do not pick or scratch at your cut.  Drink enough fluids to keep your pee (urine) clear or pale yellow. GET HELP IF:  You have redness, puffiness (swelling), or pain at the site of your cut.  You have fluid, blood, or pus coming from your cut.  Your muscles ache.  You have chills or you feel sick.  You have a bad smell coming from the cut or bandage.  Your cut opens up after stitches, staples, or adhesive strips have been removed.  You keep feeling sick to your stomach (nauseous) or keep throwing up (vomiting).  You have a fever.  You are dizzy. GET HELP RIGHT AWAY IF:  You have a rash.  You pass out (faint).  You have trouble breathing. MAKE SURE YOU:   Understand these instructions.  Will watch your condition.  Will get help right away if you are not doing well or get worse.   This information is not intended to replace advice given to you by your health care provider. Make sure you discuss any questions you have with your health care provider.   Document Released: 09/27/2011 Document Revised: 07/26/2014 Document Reviewed: 08/29/2013 Elsevier Interactive Patient Education 2016 Elsevier Inc.   Postpartum Care After Cesarean Delivery After you  deliver your newborn (postpartum period), the usual stay in the hospital is 24-72 hours. If there were problems with your labor or delivery, or if you have other medical problems, you might be in the hospital longer.  While you are in the hospital, you will receive help and instructions on how to care for yourself and your newborn during the postpartum period.  While you are in the hospital:  It is normal for you to have pain or discomfort from the incision in your abdomen. Be sure to tell your nurses when you are having pain, where the pain is located, and what makes the pain worse.  If you are breastfeeding, you may feel uncomfortable contractions of your uterus for a couple of weeks. This is normal. The contractions help your uterus get back to normal size.  It is normal to have some bleeding after delivery.  For the first 1-3 days after delivery, the flow is red and the amount may be similar to a period.  It is common for the flow to start and stop.  In the first few days, you may pass some small clots. Let your nurses know if you begin to pass large clots or your flow increases.  Do not  flush blood clots down the toilet before having the nurse look at them.  During the next 3-10 days after delivery, your flow should become more watery and pink or brown-tinged  in color.  Ten to fourteen days after delivery, your flow should be a small amount of yellowish-white discharge.  The amount of your flow will decrease over the first few weeks after delivery. Your flow may stop in 6-8 weeks. Most women have had their flow stop by 12 weeks after delivery.  You should change your sanitary pads frequently.  Wash your hands thoroughly with soap and water for at least 20 seconds after changing pads, using the toilet, or before holding or feeding your newborn.  Your intravenous (IV) tubing will be removed when you are drinking enough fluids.  The urine drainage tube (urinary catheter) that was  inserted before delivery may be removed within 6-8 hours after delivery or when feeling returns to your legs. You should feel like you need to empty your bladder within the first 6-8 hours after the catheter has been removed.  In case you become weak, lightheaded, or faint, call your nurse before you get out of bed for the first time and before you take a shower for the first time.  Within the first few days after delivery, your breasts may begin to feel tender and full. This is called engorgement. Breast tenderness usually goes away within 48-72 hours after engorgement occurs. You may also notice milk leaking from your breasts. If you are not breastfeeding, do not stimulate your breasts. Breast stimulation can make your breasts produce more milk.  Spending as much time as possible with your newborn is very important. During this time, you and your newborn can feel close and get to know each other. Having your newborn stay in your room (rooming in) will help to strengthen the bond with your newborn. It will give you time to get to know your newborn and become comfortable caring for your newborn.  Your hormones change after delivery. Sometimes the hormone changes can temporarily cause you to feel sad or tearful. These feelings should not last more than a few days. If these feelings last longer than that, you should talk to your caregiver.  If desired, talk to your caregiver about methods of family planning or contraception.  Talk to your caregiver about immunizations. Your caregiver may want you to have the following immunizations before leaving the hospital:  Tetanus, diphtheria, and pertussis (Tdap) or tetanus and diphtheria (Td) immunization. It is very important that you and your family (including grandparents) or others caring for your newborn are up-to-date with the Tdap or Td immunizations. The Tdap or Td immunization can help protect your newborn from getting ill.  Rubella  immunization.  Varicella (chickenpox) immunization.  Influenza immunization. You should receive this annual immunization if you did not receive the immunization during your pregnancy.   This information is not intended to replace advice given to you by your health care provider. Make sure you discuss any questions you have with your health care provider.   Document Released: 03/29/2012 Document Reviewed: 03/29/2012 Elsevier Interactive Patient Education [employer]

## 2016-02-19 NOTE — Discharge Summary (Signed)
OB Discharge Summary     Patient Name: Darlene Mcdonald [facility] DOB: 1980/07/28 MRN: [id number]  Date of admission: 02/17/2016 Delivering MD: Adam Phenix   Date of discharge: 02/19/2016  Admitting diagnosis: PREVIOUS CESAREAN SECTION Intrauterine pregnancy: [redacted]w[redacted]d     Secondary diagnosis:  Principal Problem:   Delivery by elective cesarean section Active Problems:   Advanced maternal age in multigravida   Previous cesarean delivery, delivered   Delivered by cesarean section  Additional problems: None     Discharge diagnosis: Term Pregnancy Delivered                                                                                                Post partum procedures:None  Augmentation: N/A  Complications: None  Hospital course:  Sceduled C/S   35 y.o. yo G2P2002 at [redacted]w[redacted]d was admitted to the hospital 02/17/2016 for scheduled cesarean section with the following indication:Elective Repeat.  Membrane Rupture Time/Date: 10:43 AM ,02/17/2016   Patient delivered a Viable infant.02/17/2016  Details of operation can be found in separate operative note.  Pateint had an uncomplicated postpartum course.  She is ambulating, tolerating a regular diet, passing flatus, and urinating well. Patient is discharged home in stable condition on  02/19/16          Physical exam Vitals:   02/18/16 0642 02/18/16 1835 02/18/16 1958 02/19/16 0533  BP: 100/62 (!) 94/57 100/62 (!) 93/55  Pulse: 60 75 68 68  Resp: 18 16  16   Temp: 98 F (36.7 C) 98.7 F (37.1 C)  97.6 F (36.4 C)  TempSrc:  Oral  Oral  SpO2: 98%      General: alert, cooperative and no distress Lochia: appropriate Uterine Fundus: firm Incision: Healing well with no significant drainage, No significant erythema, Dressing is clean, dry, and intact DVT Evaluation: No evidence of DVT seen on physical exam. Negative Homan's sign. No significant calf/ankle edema. Labs: Lab Results  Component Value Date   WBC 13.5 (H) 02/18/2016   HGB 9.1 (L)  02/18/2016   HCT 27.0 (L) 02/18/2016   MCV 91.5 02/18/2016   PLT 172 02/18/2016   No flowsheet data found.  Discharge instruction: per After Visit Summary and "Baby and Me Booklet".  After visit meds:    Medication List    TAKE these medications   docusate sodium 100 MG capsule Commonly known as:  COLACE Take 1 capsule (100 mg total) by mouth 2 (two) times daily.   ibuprofen 600 MG tablet Commonly known as:  ADVIL,MOTRIN Take 1 tablet (600 mg total) by mouth every 6 (six) hours as needed for mild pain or moderate pain.   oxyCODONE-acetaminophen 5-325 MG tablet Commonly known as:  PERCOCET/ROXICET Take 1 tablet by mouth every 4 (four) hours as needed for severe pain.   prenatal multivitamin Tabs tablet Take 1 tablet by mouth at bedtime.       Diet: routine diet  Activity: Advance as tolerated. Pelvic rest for 6 weeks.   Outpatient follow up:6 weeks Follow up Appt:No future appointments. Follow up Visit:No Follow-up on file.  Postpartum contraception: Depo Provera  Newborn Data: Live born female  Birth Weight: 8 lb 0.6 oz (3645 g) APGAR: 9, 9  Baby Feeding: Breast Disposition:home with mother   02/19/2016 Jen Mow, DO

## 2016-09-08 ENCOUNTER — Telehealth: Payer: Self-pay

## 2016-09-08 NOTE — Telephone Encounter (Signed)
Call was made for client to follow up with ultrasound of her son  Darlene Mcdonald. I talked with personal who informed me that the nurse with Burmese interpreter would call the client and give her this information. Her number [phone].Mother understood and said she would be expecting call. Fluor CorporationHelena Other [employer] researcherN BSN CN [employer]

## 2017-01-07 NOTE — Congregational Nurse Program (Signed)
Congregational Nurse Program Note  Date of Encounter: 01/07/2017  Past Medical History: Past Medical History:  Diagnosis Date  . Medical history non-contributory     Encounter Details:     CNP Questionnaire - 01/07/17 1440      Patient Demographics   Is this a new or existing patient? Existing   Patient is considered a/an Asylee   Race Asian     Patient Assistance   Location of Patient Assistance [place]   Patient's financial/insurance status Medicaid   Uninsured Patient ([place]ange Card/Care Connects) No   Patient referred to apply for the following financial assistance Not Applicable   Food insecurities addressed Not Applicable   Transportation assistance No   Assistance securing medications No   Educational health offerings Other;Nutrition;Navigating the healthcare system;Exercise/physical activity;Health literacy     Encounter Details   Primary purpose of visit Other;Education/Health Concerns   Was an Emergency Department visit averted? Not Applicable   Does patient have a medical provider? No   Patient referred to Not Applicable   Was a mental health screening completed? (GAINS tool) No   Does patient have dental issues? No   Does patient have vision issues? No   Since previous encounter, have you referred patient for abnormal blood pressure that resulted in a new diagnosis or medication change? No   Since previous encounter, have you referred patient for abnormal blood glucose that resulted in a new diagnosis or medication change? No     Client came in today requesting information regarding orange card. Same provided and informed of new enrollment dates of July 2nd. Advice to see a doctor regarding thyroid swelling but client declined stating that she would like to wait for orange card. Nicole CellaDorothy Sharayah Renfrow RN,BSN,PCCN,CNP [phone]

## 2017-03-25 NOTE — Congregational Nurse Program (Signed)
Congregational Nurse Program Note  Date of Encounter: 03/25/2017  Past Medical History: Past Medical History:  Diagnosis Date  . Medical history non-contributory     Encounter Details:     CNP Questionnaire - 03/25/17 1400      Patient Demographics   Is this a new or existing patient? Existing   Patient is considered a/an Asylee   Race Asian     Patient Assistance   Location of Patient Assistance [place]   Patient's financial/insurance status Self-Pay (Uninsured)   Uninsured Patient (Orange Research officer, trade unionCard/Care Connects) No   Patient referred to apply for the following financial assistance Orange [employer] Gold[employer]/Care Connects   Food insecurities addressed Not Technical brewerApplicable   Transportation assistance No   Assistance securing medications No   Educational health offerings Other;Nutrition;Navigating the healthcare system;Exercise/physical activity;Health literacy     Encounter Details   Primary purpose of visit Other  blood pressur check   Was an Emergency Department visit averted? Not Applicable   Does patient have a medical provider? No   Patient referred to Not Applicable   Was a mental health screening completed? (GAINS tool) No   Does patient have dental issues? No   Does patient have vision issues? No   Does your patient have an abnormal blood pressure today? No   Since previous encounter, have you referred patient for abnormal blood pressure that resulted in a new diagnosis or medication change? No   Does your patient have an abnormal blood glucose today? No   Since previous encounter, have you referred patient for abnormal blood glucose that resulted in a new diagnosis or medication change? No   Was there a life-saving intervention made? No    Client stopped by for blood pressure check. She has no other health concerns. Nicole Cellaorothy Muhoro RN BSN PCCN CNP [phone]

## 2017-06-17 NOTE — Congregational Nurse Program (Signed)
Congregational Nurse Program Note  Date of Encounter: 06/17/2017  Past Medical History: Past Medical History:  Diagnosis Date  . Medical history non-contributory     Encounter Details: CNP Questionnaire - 06/17/17 1300      Questionnaire   Patient Status  Asylee    Race  Asian    Location Patient Served At  Not Applicable    Insurance  Medicaid    Uninsured  Not Applicable    Food  No food insecurities    Housing/Utilities  Yes, have permanent housing    Transportation  No transportation needs    Interpersonal Safety  Yes, feel physically and emotionally safe where you currently live    Medication  No medication insecurities    Medical Provider  No    Referrals  Not Applicable    ED Visit Averted  Not Applicable    Life-Saving Intervention Made  Not Applicable       Client came to the center for BP checks. She is complaining of headache and she is to see a doctor is it becomes worse.  Nicole CellaDorothy Muhoro RN PCCN BSN CNP [phone]

## 2018-03-27 DIAGNOSIS — F4541 Pain disorder exclusively related to psychological factors: Secondary | ICD-10-CM

## 2018-03-27 NOTE — Congregational Nurse Program (Signed)
Ms Wurm came in c/o headaches.Pain gets worse when hungry.Vital and blood sugar done. Health education provided on stress management, health diet and exercice. Nicole Cella Zeanna Sunde RN BSN PCCN CNP [phone]

## 2018-11-23 ENCOUNTER — Encounter (HOSPITAL_COMMUNITY): Payer: Self-pay | Admitting: Obstetrics and Gynecology

## 2018-11-28 ENCOUNTER — Other Ambulatory Visit (HOSPITAL_COMMUNITY): Payer: Self-pay | Admitting: *Deleted

## 2018-11-28 DIAGNOSIS — N6452 Nipple discharge: Secondary | ICD-10-CM

## 2018-12-07 ENCOUNTER — Ambulatory Visit (HOSPITAL_COMMUNITY): Payer: Self-pay

## 2018-12-19 ENCOUNTER — Other Ambulatory Visit: Payer: Self-pay

## 2019-01-16 ENCOUNTER — Other Ambulatory Visit: Payer: Medicaid Other

## 2019-01-16 ENCOUNTER — Ambulatory Visit (HOSPITAL_COMMUNITY): Payer: Self-pay

## 2019-03-22 ENCOUNTER — Ambulatory Visit: Payer: Medicaid Other

## 2019-03-22 ENCOUNTER — Other Ambulatory Visit: Payer: Self-pay

## 2019-03-22 ENCOUNTER — Telehealth (HOSPITAL_COMMUNITY): Payer: Self-pay

## 2019-03-22 ENCOUNTER — Ambulatory Visit (HOSPITAL_COMMUNITY)
Admission: RE | Admit: 2019-03-22 | Discharge: 2019-03-22 | Disposition: A | Discharge status: Home / Self Care | Payer: Medicaid Other | Source: Ambulatory Visit | Attending: Obstetrics and Gynecology | Admitting: Obstetrics and Gynecology

## 2019-03-22 ENCOUNTER — Encounter (HOSPITAL_COMMUNITY): Payer: Self-pay

## 2019-03-22 DIAGNOSIS — N6452 Nipple discharge: Secondary | ICD-10-CM | POA: Insufficient documentation

## 2019-03-22 DIAGNOSIS — Z1239 Encounter for other screening for malignant neoplasm of breast: Secondary | ICD-10-CM | POA: Insufficient documentation

## 2019-03-22 NOTE — Progress Notes (Signed)
Complaints of bilateral breast discharge x one year that is clear to milky in color. Patient stopped breastfeeding one year ago.     Pap Smear: Pap smear not completed today. Last Pap smear was March 2020 at the [facility] and normal per patient. Per patient has no history of an abnormal Pap smear. Last Pap smear result is not in Epic. Previous Pap smear result from 10/16/2015 is in [place].  Physical exam: Breasts Breasts symmetrical. No skin abnormalities bilateral breasts. Bilateral nipple inversion that per patient is normal for her. Bilateral clear to milky colored discharge expressed on exam. Sample of bilateral breast discharge sent to Cytology for evaluation. No lymphadenopathy. No lumps palpated bilateral breasts. No complaints of pain or tenderness on exam. Referred patient to the [facility] for a diagnostic mammogram and bilateral breast ultrasounds. Appointment scheduled for Tuesday, April 03, 2019 at 1500.        Pelvic/Bimanual No Pap smear completed today since last Pap smear was in March 2020 per patient. Pap smear not indicated per BCCCP guidelines.   Smoking History: Patient has never smoked.  Patient Navigation: Patient education provided. Access to services provided for patient through [place] interpreter provided.   Breast and Cervical Cancer Risk Assessment: Patient has no family history of breast cancer, known genetic mutations, or radiation treatment to the chest before age 29. Patient has no history of cervical dysplasia, immunocompromised, or DES exposure in-utero.  Risk Assessment    Risk Scores      03/22/2019   Last edited by: Loletta Parish, RN   5-year risk: 0.4 %   Lifetime risk: 6 %         Used Burmese phone Interpreter [phone] from [employer].

## 2019-03-22 NOTE — Telephone Encounter (Signed)
Called patient via [employer] [phone]. Left message for patient with [facility] appointment information. Patient is scheduled on Sept. 15th @ 3:00 pm. Left number for [facility] if she needs to reschedule her appt.

## 2019-03-22 NOTE — Patient Instructions (Signed)
Explained breast self awareness with Mechele Dawley. Patient did not need a Pap smear today due to last Pap smear was in March 2020 per patient. Let her know BCCCP will cover Pap smears every 3 years unless has a history of abnormal Pap smears. Referred patient to the [facility] for a diagnostic mammogram and bilateral breast ultrasounds. Appointment scheduled for Tuesday, April 03, 2019 at 1500. Patient aware of appointment and will be there. Let patient know will follow up with her within the next couple weeks with results of breast discharge by phone. [place] verbalized understanding.  [place], Arvil Chaco, RN 12:44 PM

## 2019-04-02 ENCOUNTER — Encounter (HOSPITAL_COMMUNITY): Payer: Self-pay | Admitting: *Deleted

## 2019-04-03 ENCOUNTER — Ambulatory Visit
Admission: RE | Admit: 2019-04-03 | Discharge: 2019-04-03 | Disposition: A | Discharge status: Home / Self Care | Payer: No Typology Code available for payment source | Source: Ambulatory Visit | Attending: Obstetrics and Gynecology | Admitting: Obstetrics and Gynecology

## 2019-04-03 ENCOUNTER — Ambulatory Visit: Payer: Medicaid Other

## 2019-04-03 ENCOUNTER — Other Ambulatory Visit: Payer: Self-pay

## 2019-04-03 DIAGNOSIS — N6452 Nipple discharge: Secondary | ICD-10-CM

## 2019-04-09 ENCOUNTER — Encounter (HOSPITAL_COMMUNITY): Payer: Self-pay | Admitting: *Deleted

## 2019-04-09 NOTE — Progress Notes (Signed)
Letter mailed to patient with benign breast discharge results.  

## 2019-09-26 IMAGING — MG MM DIGITAL DIAGNOSTIC BILAT W/ TOMO W/ CAD
6 of 12 series · 6 of 36 positions shown · non-contrast
Comparison: None.

CLINICAL DATA: 38-year-old female with non spontaneous bilateral
white/milky and clear nipple discharge only occurring when pressing
on nipples in the shower (the patient states this occurs when she
everts her chronically inverted nipples to clean them).

EXAM:
DIGITAL DIAGNOSTIC BILATERAL MAMMOGRAM WITH CAD AND TOMO

[L MLO synth-2D (1 of 2)]
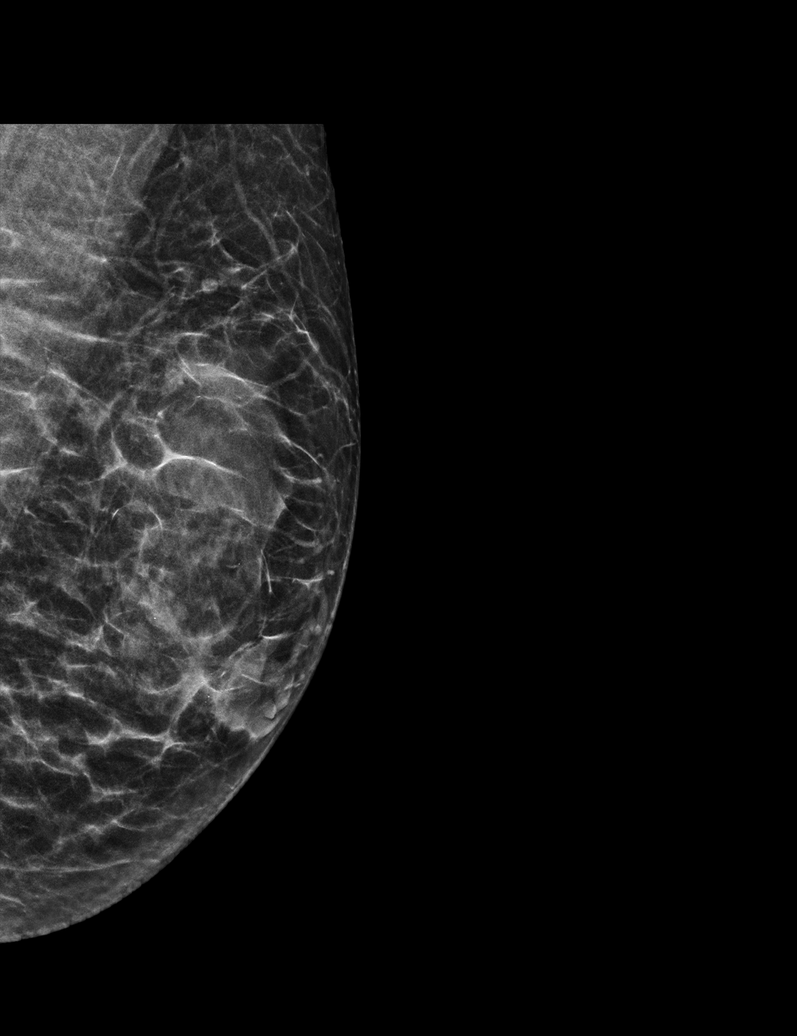

[L CC synth-2D]
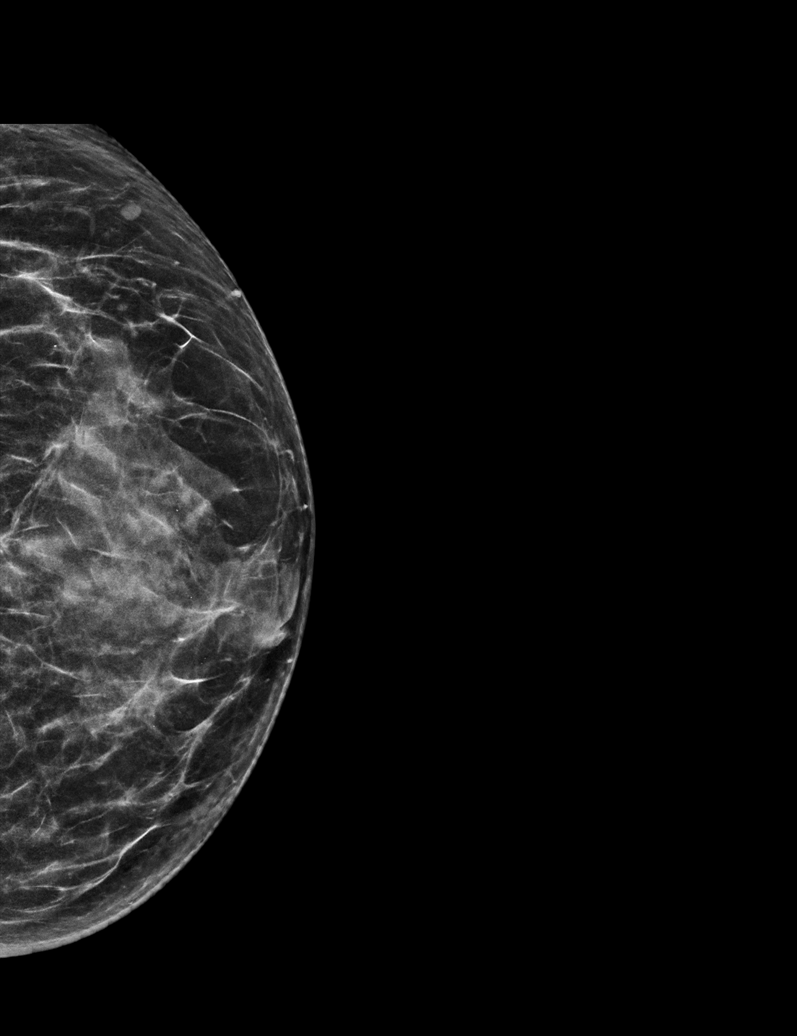

[R CC synth-2D]
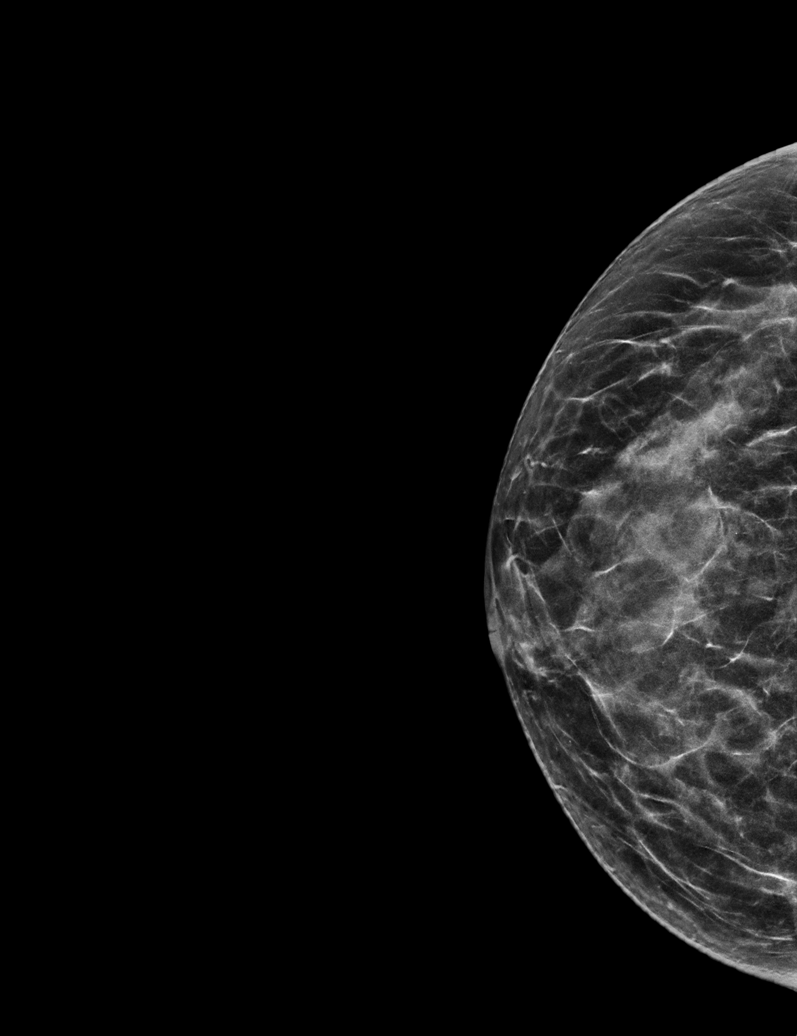

[R MLO synth-2D (1 of 2)]
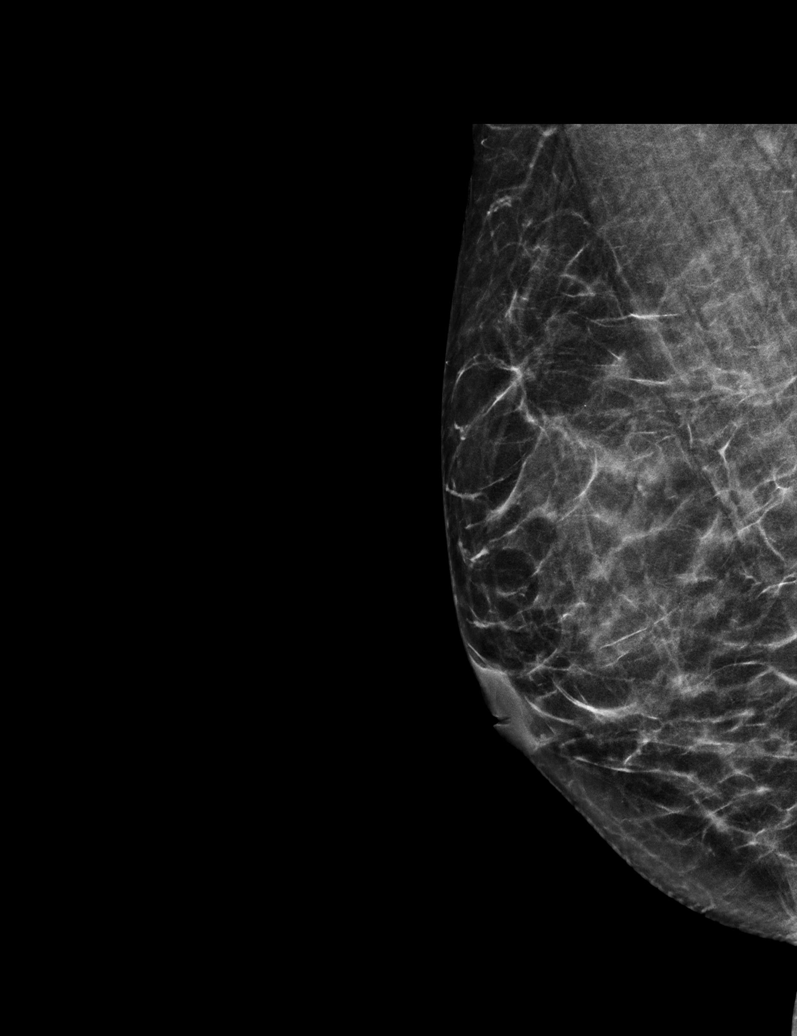

[L MLO synth-2D (2 of 2)]
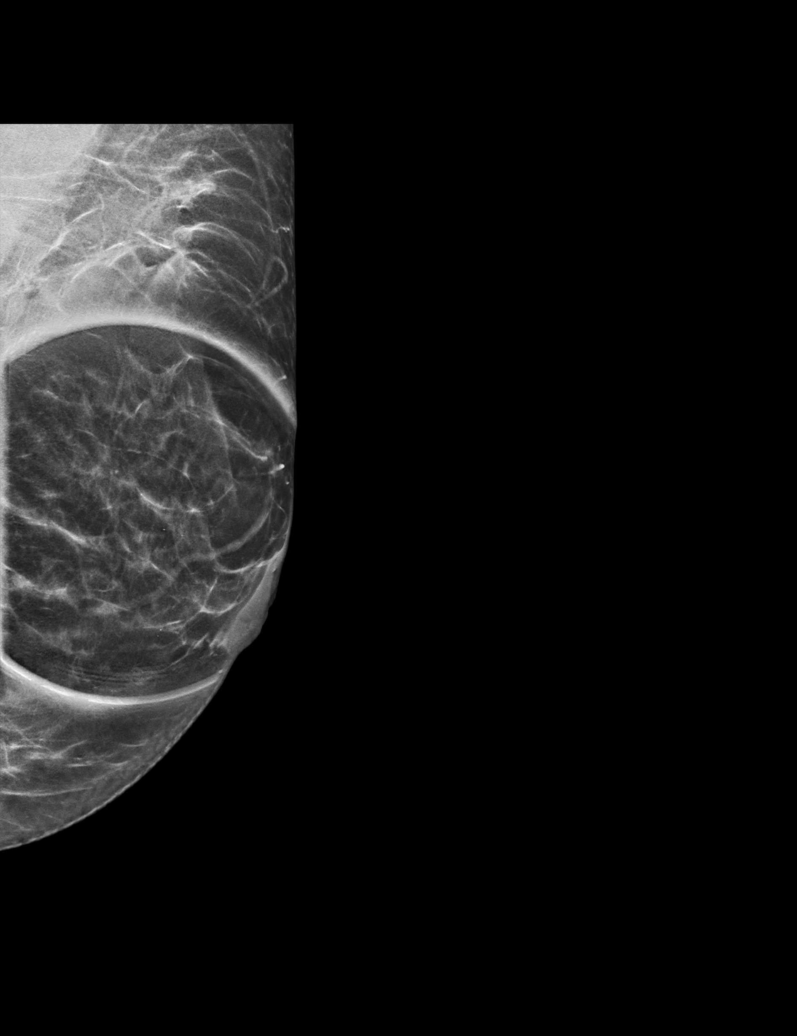

[R MLO synth-2D (2 of 2)]
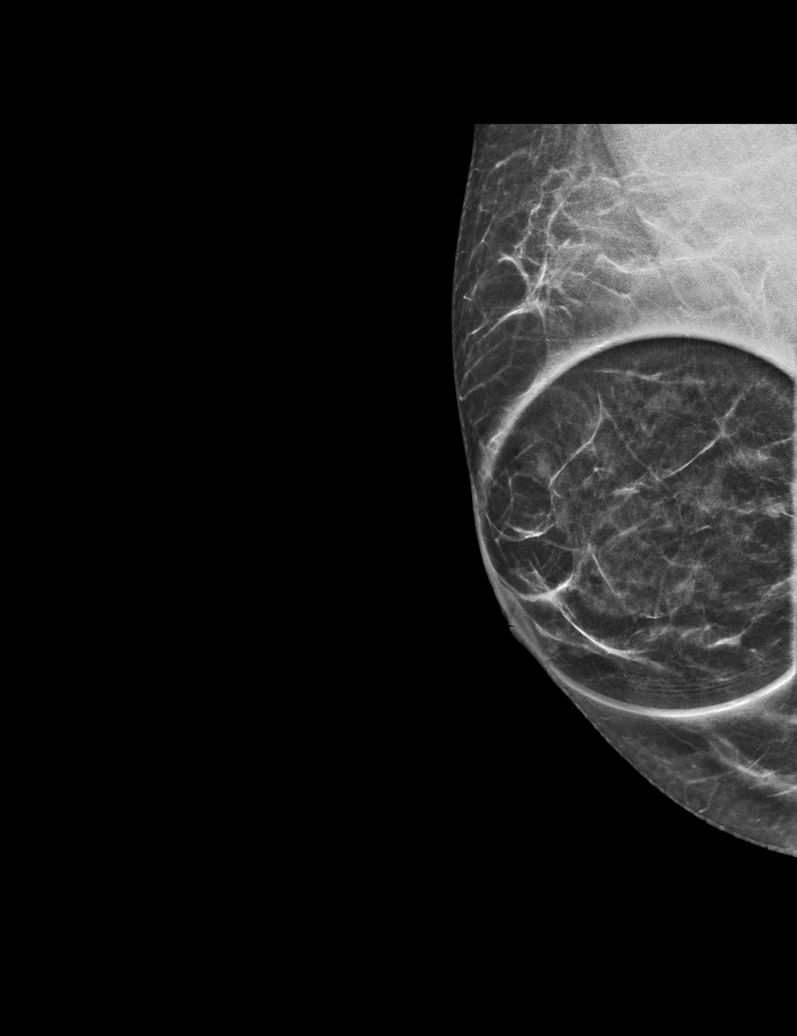

[6 of 36 positions shown; findings below may reference images not displayed]

ACR Breast Density Category c: The breast tissue is heterogeneously
dense, which may obscure small masses.
FINDINGS: No suspicious masses or calcifications are seen in either breast.
There is no mammographic evidence of malignancy in either breast.

Mammographic images were processed with CAD.
IMPRESSION: No mammographic evidence of malignancy in either breast.

RECOMMENDATION:
1. Recommend further management of the bilateral nipple discharge be
based on clinical assessment. This is felt to be physiologic given
bilaterality, non spontaneous nature and white/milky appearance.

2.  Screening mammogram in one year.(Code:NM-2-IKG)

I have discussed the findings and recommendations with the patient.
If applicable, a reminder letter will be sent to the patient
regarding the next appointment.

BI-RADS CATEGORY  1: Negative.
